# Patient Record
Sex: Male | Born: 1990 | Race: White | Hispanic: No | Marital: Single | Smoking: Current every day smoker
Health system: Southern US, Community
[De-identification: ages and names within clinical notes are randomized; demographics above are authoritative.]

## PROBLEM LIST (undated history)

## (undated) DIAGNOSIS — Z789 Other specified health status: Secondary | ICD-10-CM

## (undated) HISTORY — PX: OTHER SURGICAL HISTORY: SHX169

## (undated) HISTORY — DX: Other specified health status: Z78.9

---

## 2008-03-22 ENCOUNTER — Ambulatory Visit (HOSPITAL_COMMUNITY): Admission: RE | Admit: 2008-03-22 | Discharge: 2008-03-22 | Payer: Self-pay | Admitting: Internal Medicine

## 2009-01-04 ENCOUNTER — Ambulatory Visit (HOSPITAL_COMMUNITY): Admission: RE | Admit: 2009-01-04 | Discharge: 2009-01-04 | Payer: Self-pay | Admitting: Internal Medicine

## 2010-01-02 ENCOUNTER — Ambulatory Visit (HOSPITAL_COMMUNITY): Admission: RE | Admit: 2010-01-02 | Discharge: 2010-01-02 | Payer: Self-pay | Admitting: Internal Medicine

## 2010-07-23 ENCOUNTER — Encounter: Payer: Self-pay | Admitting: Internal Medicine

## 2015-04-13 ENCOUNTER — Emergency Department (HOSPITAL_COMMUNITY)
Admission: EM | Admit: 2015-04-13 | Discharge: 2015-04-13 | Disposition: A | Discharge status: Home / Self Care | Payer: Self-pay | Attending: Emergency Medicine | Admitting: Emergency Medicine

## 2015-04-13 ENCOUNTER — Encounter (HOSPITAL_COMMUNITY): Payer: Self-pay | Admitting: *Deleted

## 2015-04-13 DIAGNOSIS — Z720 Tobacco use: Secondary | ICD-10-CM | POA: Insufficient documentation

## 2015-04-13 DIAGNOSIS — K625 Hemorrhage of anus and rectum: Secondary | ICD-10-CM | POA: Insufficient documentation

## 2015-04-13 LAB — COMPREHENSIVE METABOLIC PANEL
ALBUMIN: 4.7 g/dL (ref 3.5–5.0)
ALT: 128 U/L — ABNORMAL HIGH (ref 17–63)
ANION GAP: 8 (ref 5–15)
AST: 65 U/L — ABNORMAL HIGH (ref 15–41)
Alkaline Phosphatase: 61 U/L (ref 38–126)
BILIRUBIN TOTAL: 0.6 mg/dL (ref 0.3–1.2)
BUN: 19 mg/dL (ref 6–20)
CALCIUM: 9.3 mg/dL (ref 8.9–10.3)
CO2: 26 mmol/L (ref 22–32)
Chloride: 105 mmol/L (ref 101–111)
Creatinine, Ser: 0.92 mg/dL (ref 0.61–1.24)
GLUCOSE: 92 mg/dL (ref 65–99)
POTASSIUM: 3.8 mmol/L (ref 3.5–5.1)
Sodium: 139 mmol/L (ref 135–145)
TOTAL PROTEIN: 7.7 g/dL (ref 6.5–8.1)

## 2015-04-13 LAB — CBC WITH DIFFERENTIAL/PLATELET
BASOS PCT: 0 %
Basophils Absolute: 0 10*3/uL (ref 0.0–0.1)
Eosinophils Absolute: 0 10*3/uL (ref 0.0–0.7)
Eosinophils Relative: 0 %
HEMATOCRIT: 44.9 % (ref 39.0–52.0)
Hemoglobin: 15.6 g/dL (ref 13.0–17.0)
Lymphocytes Relative: 22 %
Lymphs Abs: 2.8 10*3/uL (ref 0.7–4.0)
MCH: 35.4 pg — AB (ref 26.0–34.0)
MCHC: 34.7 g/dL (ref 30.0–36.0)
MCV: 101.8 fL — ABNORMAL HIGH (ref 78.0–100.0)
MONO ABS: 0.8 10*3/uL (ref 0.1–1.0)
Monocytes Relative: 6 %
NEUTROS ABS: 9.1 10*3/uL — AB (ref 1.7–7.7)
NEUTROS PCT: 72 %
Platelets: 247 10*3/uL (ref 150–400)
RBC: 4.41 MIL/uL (ref 4.22–5.81)
RDW: 13.5 % (ref 11.5–15.5)
WBC: 12.8 10*3/uL — AB (ref 4.0–10.5)

## 2015-04-13 NOTE — Discharge Instructions (Signed)
Follow-up with Dr.rourk the stomach specialist in 1-2 weeks for your rectal bleeding and elevated liver studies

## 2015-04-13 NOTE — ED Notes (Signed)
Patient is being discharged

## 2015-04-13 NOTE — ED Notes (Signed)
Pt states rectal bleeding, bright red in color with BMs. This has occurred intermittently x 2 years, occuring more often recently. Discomfort  to rectal area.

## 2015-04-15 NOTE — ED Provider Notes (Signed)
CSN: [id number]     Arrival date & time 04/13/15  1651 History   First MD Initiated Contact with Patient 04/13/15 1934     Chief Complaint  Patient presents with  . Rectal Bleeding     (Consider location/radiation/quality/duration/timing/severity/associated sxs/prior Treatment) Patient is a 24 y.o. male presenting with hematochezia. The history is provided by the patient (Patient states he has had some bright red bleeding per rectum off and on for months. No other medical problem).  Rectal Bleeding Quality:  Bright red Amount:  Moderate Timing:  Intermittent Progression:  Waxing and waning Chronicity:  New Context: not anal fissures   Associated symptoms: no abdominal pain     History reviewed. No pertinent past medical history. History reviewed. No pertinent past surgical history. No family history on file. Social History  Substance Use Topics  . Smoking status: Current Every Day Smoker    Types: Cigarettes  . Smokeless tobacco: None  . Alcohol Use: Yes     Comment: Occ    Review of Systems  Constitutional: Negative for appetite change and fatigue.  HENT: Negative for congestion, ear discharge and sinus pressure.   Eyes: Negative for discharge.  Respiratory: Negative for cough.   Cardiovascular: Negative for chest pain.  Gastrointestinal: Positive for hematochezia. Negative for abdominal pain and diarrhea.       Rectal bleeding  Genitourinary: Negative for frequency and hematuria.  Musculoskeletal: Negative for back pain.  Skin: Negative for rash.  Neurological: Negative for seizures and headaches.  Psychiatric/Behavioral: Negative for hallucinations.      Allergies  Review of patient's allergies indicates no known allergies.  Home Medications   Prior to Admission medications   Not on File   BP 136/89 mmHg  Pulse 71  Temp(Src) 98 F (36.7 C) (Oral)  Resp 20  Ht  (1.88 m)  Wt 250 lb (113.399 kg)  BMI 32.08 kg/m2  SpO2 98% Physical Exam   Constitutional: He is oriented to person, place, and time. He appears well-developed.  HENT:  Head: Normocephalic.  Eyes: Conjunctivae and EOM are normal. No scleral icterus.  Neck: Neck supple. No thyromegaly present.  Cardiovascular: Normal rate and regular rhythm.  Exam reveals no gallop and no friction rub.   No murmur heard. Pulmonary/Chest: No stridor. He has no wheezes. He has no rales. He exhibits no tenderness.  Abdominal: He exhibits no distension. There is no tenderness. There is no rebound.  Genitourinary:  Normal rectal exam heme-negative  Musculoskeletal: Normal range of motion. He exhibits no edema.  Lymphadenopathy:    He has no cervical adenopathy.  Neurological: He is oriented to person, place, and time. He exhibits normal muscle tone. Coordination normal.  Skin: No rash noted. No erythema.  Psychiatric: He has a normal mood and affect. His behavior is normal.    ED Course  Procedures (including critical care time) Labs Review Labs Reviewed  CBC WITH DIFFERENTIAL/PLATELET - Abnormal; Notable for the following:    WBC 12.8 (*)    MCV 101.8 (*)    MCH 35.4 (*)    Neutro Abs 9.1 (*)    All other components within normal limits  COMPREHENSIVE METABOLIC PANEL - Abnormal; Notable for the following:    AST 65 (*)    ALT 128 (*)    All other components within normal limits    Imaging Review No results found. I have personally reviewed and evaluated these images and lab results as part of my medical decision-making.   EKG Interpretation  None      MDM   Final diagnoses:  Rectal bleeding    Labs show mildly elevated liver studies. Patient will be referred to a GI doctor for his rectal bleeding and elevated liver study    Bethann BerkshireJoseph Bryley Kovacevic, MD 04/15/15 1017

## 2015-04-18 ENCOUNTER — Ambulatory Visit (INDEPENDENT_AMBULATORY_CARE_PROVIDER_SITE_OTHER): Payer: Self-pay | Admitting: Nurse Practitioner

## 2015-04-18 ENCOUNTER — Encounter: Payer: Self-pay | Admitting: Nurse Practitioner

## 2015-04-18 VITALS — BP 154/84 | HR 69 | Temp 97.6°F | Ht 74.0 in | Wt 245.4 lb

## 2015-04-18 DIAGNOSIS — K625 Hemorrhage of anus and rectum: Secondary | ICD-10-CM

## 2015-04-18 DIAGNOSIS — R945 Abnormal results of liver function studies: Secondary | ICD-10-CM

## 2015-04-18 DIAGNOSIS — R7989 Other specified abnormal findings of blood chemistry: Secondary | ICD-10-CM

## 2015-04-18 NOTE — Patient Instructions (Signed)
1. We will be due to stool tests to do at home. 2. Have your labs drawn when you're able. 3. Return for follow-up in 4 weeks.

## 2015-04-18 NOTE — Assessment & Plan Note (Signed)
Patient with complaints of rectal bleeding. Rectal exam with stool card for heme negative in the ER. This is again negative today. However, the bowel movement he just had an office he states he did not notice blood for the first time in a while. At this point we will send him home with an iFob for him to use when he notes what he feels to be blood in his stool and return for further evaluation. He does have a family history of colon cancer although this is a day paternal grandparent and not a first-degree relative. Depending on results of his heme iFob we can consider pursuing colonoscopy as needed. Return for follow-up in 4 weeks at which point we will recheck a CBC and hepatic function panel as noted below.

## 2015-04-18 NOTE — Assessment & Plan Note (Signed)
Noted to have elevated LFTs on his last CMP with AST/ALT 65/128. No other liver function derangements noted. Denies alcohol use. Does not currently take any medications. When he returns for follow-up in 4 weeks we will recheck his hepatic function panel as well as CBC. Can pursue other hepatic testing pending results of recheck of his liver function.

## 2015-04-18 NOTE — Progress Notes (Signed)
Primary Care Physician:  No PCP Per Patient Primary Gastroenterologist:  Dr. Darrick PennaFields  Chief Complaint  Patient presents with  . Rectal Bleeding  . Rectal Pain    HPI:   24 year old male presents for follow-up from the emergency room for rectal bleeding. ER notes reviewed. He presented on 04/13/2015 for hematochezia which was on and off for months. No abdominal pain at that time. ER exam notes heme negative stool. Hemoglobin 15.6, hematocrit 44.9. WBC 12.8. CMP showed elevated AST/ALT at 65/128.  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 It's been over a year. Sees blood every morning for the past month. Blood is on the stool and on the toilet tissue. Denies abdominal pain, N/V. Has sensation in his rectum "not feeling right." Has a bowel movement daily consistent with [place] 7, tends to sit for 5-10 minutes. Saw blood yesterday, no bowel movement today. Denies fever, chills, unintentional weight loss. Denies chest pain, dyspnea, dizziness, lightheadedness, syncope, near syncope. Denies any other upper or lower GI symptoms.  Past Medical History  Diagnosis Date  . Medical history non-contributory     "I don't really go to the doctor"    Past Surgical History  Procedure Laterality Date  . None to date      No current outpatient prescriptions on file.   No current facility-administered medications for this visit.    Allergies as of 04/18/2015  . (No Known Allergies)    Family History  Problem Relation Age of Onset  . Colon cancer Paternal Grandfather     Social History   Social History  . Marital Status: Single    Spouse Name: N/A  . Number of Children: N/A  . Years of Education: N/A   Occupational History  . Not on file.   Social History Main Topics  . Smoking status: Current Every Day Smoker -- 1.00 packs/day for 10 years    Types: Cigarettes  .  Smokeless tobacco: Never Used  . Alcohol Use: 0.0 oz/week    0 Standard drinks or equivalent per week     Comment: Rarely about 3-4 times a year.  . Drug Use: Yes    Special: Marijuana     Comment: occasionally  . Sexual Activity: Not on file   Other Topics Concern  . Not on file   Social History Narrative    Review of Systems: 10-point ROS negative except as per HPI    Physical Exam: BP 154/84 mmHg  Pulse 69  Temp(Src) 97.6 F (36.4 C) (Oral)  Ht 6\' 2"  (1.88 m)  Wt 245 lb 6.4 oz (111.313 kg)  BMI 31.49 kg/m2 General:   Alert and oriented. Pleasant and cooperative. Well-nourished and well-developed.  Head:  Normocephalic and atraumatic. Eyes:  Without icterus, sclera clear and conjunctiva pink.  Ears:  Normal auditory acuity. Cardiovascular:  S1, S2 present without murmurs appreciated. Extremities without clubbing or edema. Respiratory:  Clear to auscultation bilaterally. No wheezes, rales, or rhonchi. No distress.  Gastrointestinal:  +BS, soft, non-tender and non-distended. No HSM noted. No guarding or rebound. No masses appreciated.  Rectal:  No external or internal hemorrhoids appreciated, rectal exam hemoccult negative.  Musculoskalatal:  Symmetrical without gross deformities. Normal posture. Skin:  Intact without significant lesions or rashes. Neurologic:  Alert and oriented x4;  grossly normal neurologically. Psych:  Alert and cooperative. Normal mood and affect. Heme/Lymph/Immune: No  excessive bruising noted.    04/18/2015 9:13 AM

## 2015-04-19 NOTE — Progress Notes (Signed)
No pcp per patient 

## 2015-05-22 ENCOUNTER — Encounter: Payer: Self-pay | Admitting: Gastroenterology

## 2015-05-22 ENCOUNTER — Ambulatory Visit: Payer: Self-pay | Admitting: Gastroenterology

## 2017-05-12 ENCOUNTER — Emergency Department (HOSPITAL_COMMUNITY)
Admission: EM | Admit: 2017-05-12 | Discharge: 2017-05-12 | Disposition: A | Discharge status: Home / Self Care | Payer: Self-pay | Attending: Emergency Medicine | Admitting: Emergency Medicine

## 2017-05-12 ENCOUNTER — Encounter (HOSPITAL_COMMUNITY): Payer: Self-pay | Admitting: Cardiology

## 2017-05-12 DIAGNOSIS — F17210 Nicotine dependence, cigarettes, uncomplicated: Secondary | ICD-10-CM | POA: Insufficient documentation

## 2017-05-12 DIAGNOSIS — Z23 Encounter for immunization: Secondary | ICD-10-CM | POA: Insufficient documentation

## 2017-05-12 DIAGNOSIS — K645 Perianal venous thrombosis: Secondary | ICD-10-CM | POA: Insufficient documentation

## 2017-05-12 MED ORDER — TETANUS-DIPHTHERIA TOXOIDS TD 5-2 LFU IM INJ: 0.5000 mL | INJECTION | Freq: Once | INTRAMUSCULAR | Status: DC

## 2017-05-12 MED ORDER — LIDOCAINE HCL (PF) 1 % IJ SOLN
5.0000 mL | Freq: Once | INTRAMUSCULAR | Status: AC
Start: 2017-05-12 — End: 2017-05-12
  Administered 2017-05-12: 5 mL
  Filled 2017-05-12: qty 6

## 2017-05-12 MED ORDER — TETANUS-DIPHTH-ACELL PERTUSSIS 5-2.5-18.5 LF-MCG/0.5 IM SUSP
INTRAMUSCULAR | Status: AC
  Filled 2017-05-12: qty 0.5

## 2017-05-12 MED ORDER — IBUPROFEN 800 MG PO TABS
800.0000 mg | ORAL_TABLET | Freq: Once | ORAL | Status: AC
  Administered 2017-05-12: 800 mg via ORAL
  Filled 2017-05-12: qty 1

## 2017-05-12 MED ORDER — TETANUS-DIPHTH-ACELL PERTUSSIS 5-2.5-18.5 LF-MCG/0.5 IM SUSP
0.5000 mL | Freq: Once | INTRAMUSCULAR | Status: AC
  Administered 2017-05-12: 0.5 mL via INTRAMUSCULAR

## 2017-05-12 NOTE — ED Provider Notes (Signed)
[facility] Provider [facility]   CSN: [id number] Arrival date & time: 05/12/17  1432     History   Chief Complaint No chief complaint on file.   HPI Jamie Terry is a 26 y.o. male who presents to the ED with rectal pain. Patient reports that for the past few days he felt a knot at the anus and not sure if it is a hemorrhoid. The area is very painful. Patient has used cream with lidocaine and other OTC medications without relief. He has also used sitz baths.   The history is provided by the patient. No language interpreter was used.    Past Medical History:  Diagnosis Date  . Medical history non-contributory    "I don't really go to the doctor"    Patient Active Problem List   Diagnosis Date Noted  . Rectal bleeding 04/18/2015  . Elevated LFTs 04/18/2015    Past Surgical History:  Procedure Laterality Date  . None to date         Home Medications    Prior to Admission medications   Not on File    Family History Family History  Problem Relation Age of Onset  . Colon cancer Paternal Grandfather     Social History Social History   Tobacco Use  . Smoking status: Current Every Day Smoker    Packs/day: 1.00    Years: 10.00    Pack years: 10.00    Types: Cigarettes  . Smokeless tobacco: Never Used  Substance Use Topics  . Alcohol use: Yes    Alcohol/week: 0.0 oz    Comment: Rarely about 3-4 times a year.  . Drug use: Yes    Types: Marijuana    Comment: occasionally     Allergies   Patient has no known allergies.   Review of Systems Review of Systems  Constitutional: Negative for chills and fever.  HENT: Negative.   Eyes: Negative for visual disturbance.  Respiratory: Negative for cough.   Cardiovascular: Negative for chest pain.  Gastrointestinal: Positive for rectal pain. Negative for abdominal pain, nausea and vomiting.  Genitourinary: Negative for frequency and urgency.  Musculoskeletal: Negative for back pain.    Skin: Positive for color change.  Neurological: Negative for dizziness and headaches.  Psychiatric/Behavioral: Negative for confusion.     Physical Exam Updated Vital Signs BP (!) 163/86 (BP Location: Left Wrist)   Pulse 89   Temp 98.1 F (36.7 C) (Oral)   Resp 18   Ht 6\' 2"  (1.88 Terry)   Wt 127 kg (280 lb)   SpO2 97%   BMI 35.95 kg/Terry   Physical Exam  Constitutional: He appears well-developed and well-nourished. No distress.  HENT:  Head: Normocephalic.  Eyes: EOM are normal.  Neck: Neck supple.  Cardiovascular: Normal rate.  Pulmonary/Chest: Effort normal.  Thrombosed hemorrhoid noted   Genitourinary: Rectal exam shows external hemorrhoid.  Musculoskeletal: Normal range of motion.  Neurological: He is alert.  Skin: Skin is warm and dry.  Psychiatric: He has a normal mood and affect. His behavior is normal.  Nursing note and vitals reviewed.    ED Treatments / Results  Labs (all labs ordered are listed, but only abnormal results are displayed) Labs Reviewed - No data to display   Radiology No results found.  Procedures .Marland Kitchen.Incision and Drainage Date/Time: 05/12/2017 6:15 PM Performed by: Jamie Terry, Jamie Dye M, NP Authorized by: Jamie Terry, Jamie Claw M, NP   Consent:    Consent obtained:  Verbal   Consent  given by:  Patient   Risks discussed:  Bleeding, incomplete drainage and pain   Alternatives discussed:  Alternative treatment Location:    Type:  External thrombosed hemorrhoid   Location:  Anogenital   Anogenital location:  Rectum Pre-procedure details:    Skin preparation:  Betadine Anesthesia (see MAR for exact dosages):    Anesthesia method:  Local infiltration   Local anesthetic:  Lidocaine 1% w/o epi Procedure type:    Complexity:  Complex Procedure details:    Needle aspiration: no     Incision types:  Single straight   Scalpel blade:  11   Wound management:  Probed and deloculated and irrigated with saline   Drainage:  Bloody   Drainage amount:   Moderate   Wound treatment:  Wound left open Post-procedure details:    Patient tolerance of procedure:  Tolerated well, no immediate complications   (including critical care time)  Medications Ordered in ED Medications  Tdap (BOOSTRIX) 5-2.5-18.5 LF-MCG/0.5 injection (not administered)  lidocaine (PF) (XYLOCAINE) 1 % injection 5 mL (5 mLs Infiltration Given 05/12/17 1828)  ibuprofen (ADVIL,MOTRIN) tablet 800 mg (800 mg Oral Given 05/12/17 1924)  Tdap (BOOSTRIX) injection 0.5 mL (0.5 mLs Intramuscular Given 05/12/17 1924)     Initial Impression / Assessment and Plan / ED Course  I have reviewed the triage vital signs and the nursing notes. 26 y.o. male with thrombosed hemorrhoid stable for d/c after area opened and clot removed. Discussed with the patient home care and f/u with general surgeon. Referral given. Patient agrees with plan.  Return precautions discussed. Final Clinical Impressions(s) / ED Diagnoses   Final diagnoses:  External hemorrhoid, thrombosed    ED Discharge Orders    None       Jamie Terry, Jamie Terry, [place]NP 05/12/17 Jamie Asal2000    Jamie Terry, Joseph, MD 05/13/17 [phone]

## 2017-05-12 NOTE — ED Triage Notes (Signed)
Per girlfriend pt has a mass coming out of his anus.  Pt c/o pain.

## 2017-05-14 ENCOUNTER — Encounter (HOSPITAL_COMMUNITY): Payer: Self-pay | Admitting: Emergency Medicine

## 2017-05-14 ENCOUNTER — Emergency Department (HOSPITAL_COMMUNITY)
Admission: EM | Admit: 2017-05-14 | Discharge: 2017-05-14 | Disposition: A | Discharge status: Home / Self Care | Payer: Self-pay | Attending: Emergency Medicine | Admitting: Emergency Medicine

## 2017-05-14 ENCOUNTER — Emergency Department (HOSPITAL_COMMUNITY): Payer: Self-pay

## 2017-05-14 ENCOUNTER — Other Ambulatory Visit: Payer: Self-pay

## 2017-05-14 DIAGNOSIS — R072 Precordial pain: Secondary | ICD-10-CM | POA: Insufficient documentation

## 2017-05-14 DIAGNOSIS — F17210 Nicotine dependence, cigarettes, uncomplicated: Secondary | ICD-10-CM | POA: Insufficient documentation

## 2017-05-14 LAB — I-STAT TROPONIN, ED
TROPONIN I, POC: 0 ng/mL (ref 0.00–0.08)
Troponin i, poc: 0 ng/mL (ref 0.00–0.08)

## 2017-05-14 LAB — I-STAT CHEM 8, ED
BUN: 12 mg/dL (ref 6–20)
CALCIUM ION: 1.08 mmol/L — AB (ref 1.15–1.40)
CHLORIDE: 105 mmol/L (ref 101–111)
Creatinine, Ser: 0.8 mg/dL (ref 0.61–1.24)
Glucose, Bld: 107 mg/dL — ABNORMAL HIGH (ref 65–99)
HCT: 44 % (ref 39.0–52.0)
Hemoglobin: 15 g/dL (ref 13.0–17.0)
POTASSIUM: 3.5 mmol/L (ref 3.5–5.1)
SODIUM: 140 mmol/L (ref 135–145)
TCO2: 24 mmol/L (ref 22–32)

## 2017-05-14 MED ORDER — NITROGLYCERIN 0.4 MG SL SUBL
0.4000 mg | SUBLINGUAL_TABLET | SUBLINGUAL | Status: DC | PRN
  Administered 2017-05-14: 0.4 mg via SUBLINGUAL
  Filled 2017-05-14: qty 1

## 2017-05-14 MED ORDER — GI COCKTAIL ~~LOC~~
30.0000 mL | Freq: Once | ORAL | Status: AC
  Administered 2017-05-14: 30 mL via ORAL
  Filled 2017-05-14: qty 30

## 2017-05-14 NOTE — ED Notes (Signed)
I-Stat POC Troponin: 0.00

## 2017-05-14 NOTE — ED Notes (Signed)
POC Troponin 0.00

## 2017-05-14 NOTE — ED Provider Notes (Signed)
[facility] Provider [facility]   CSN: [id number] Arrival date & time: 05/14/17  [id number]     History   Chief Complaint Chief Complaint  Patient presents with  . Chest Pain    HPI Jamie Terry is a 26 y.o. male.  The history is provided by the patient and a significant other.  Chest Pain   This is a new problem. The current episode started less than 1 hour ago. The problem occurs constantly. The problem has not changed since onset.The pain is associated with rest. The pain is present in the substernal region. The pain is severe. The quality of the pain is described as pressure-like. The pain does not radiate. Associated symptoms include dizziness and shortness of breath. Pertinent negatives include no fever, no hemoptysis, no syncope and no vomiting. Treatments tried: ASA. The treatment provided no relief. Risk factors include male gender and smoking/tobacco exposure.  Pertinent negatives for past medical history include no CAD and no PE.  His family medical history is significant for heart disease.   Pt reports onset of chest pain/pressure while at rest Family reports he was diaphoretic Pt reports feeling SOB He has never had this before No known medical conditions He is a heavy smoker He reports fam h/o CAD No recent travel/surgery  Past Medical History:  Diagnosis Date  . Medical history non-contributory    "I don't really go to the doctor"    Patient Active Problem List   Diagnosis Date Noted  . Rectal bleeding 04/18/2015  . Elevated LFTs 04/18/2015    Past Surgical History:  Procedure Laterality Date  . None to date         Home Medications    Prior to Admission medications   Not on File    Family History Family History  Problem Relation Age of Onset  . Colon cancer Paternal Grandfather     Social History Social History   Tobacco Use  . Smoking status: Current Every Day Smoker    Packs/day: 1.00    Years: 10.00    Pack  years: 10.00    Types: Cigarettes  . Smokeless tobacco: Never Used  Substance Use Topics  . Alcohol use: Yes    Alcohol/week: 0.0 oz    Comment: Rarely about 3-4 times a year.  . Drug use: Yes    Types: Marijuana    Comment: occasionally     Allergies   Patient has no known allergies.   Review of Systems Review of Systems  Constitutional: Negative for fever.  Respiratory: Positive for shortness of breath. Negative for hemoptysis.   Cardiovascular: Positive for chest pain. Negative for syncope.  Gastrointestinal: Negative for vomiting.  Neurological: Positive for dizziness and light-headedness.  All other systems reviewed and are negative.    Physical Exam Updated Vital Signs BP 140/83   Pulse 70   Temp 97.9 F (36.6 C)   Resp 17   Ht 1.88 m (6\' 2" )   Wt 127 kg (280 lb)   SpO2 99%   BMI 35.95 kg/m   Physical Exam CONSTITUTIONAL: Well developed/well nourished, no distress HEAD: Normocephalic/atraumatic EYES: EOMI/PERRL ENMT: Mucous membranes moist NECK: supple no meningeal signs SPINE/BACK:entire spine nontender CV: S1/S2 noted, no murmurs/rubs/gallops noted LUNGS: Lungs are clear to auscultation bilaterally, no apparent distress ABDOMEN: soft, nontender, no rebound or guarding, bowel sounds noted throughout abdomen GU:no cva tenderness NEURO: Pt is awake/alert/appropriate, moves all extremitiesx4.  No facial droop.   EXTREMITIES: pulses normal/equal, full ROM, no  calf tenderness noted SKIN: warm, color normal PSYCH: no abnormalities of mood noted, alert and oriented to situation   ED Treatments / Results  Labs (all labs ordered are listed, but only abnormal results are displayed) Labs Reviewed  I-STAT CHEM 8, ED - Abnormal; Notable for the following components:      Result Value   Glucose, Bld 107 (*)    Calcium, Ion 1.08 (*)    All other components within normal limits  I-STAT TROPONIN, ED  I-STAT TROPONIN, ED    EKG  EKG  Interpretation  Date/Time:  Wednesday May 14 2017 02:44:58 EST Ventricular Rate:  61 PR Interval:    QRS Duration: 117 QT Interval:  383 QTC Calculation: 386 R Axis:   103 Text Interpretation:  Sinus rhythm Nonspecific intraventricular conduction delay Baseline wander in lead(s) V5 V6 No previous ECGs available Confirmed by Zadie RhineWickline, Mylin Hirano ([id number]) on 05/14/2017 2:57:05 AM       Radiology Dg Chest 2 View  Result Date: 05/14/2017 CLINICAL DATA:  Acute onset of mid to right upper chest pain. EXAM: CHEST  2 VIEW COMPARISON:  None. FINDINGS: The lungs are well-aerated. Mild left basilar atelectasis is noted. There is no evidence of pleural effusion or pneumothorax. The heart is normal in size; the mediastinal contour is within normal limits. No acute osseous abnormalities are seen. IMPRESSION: Mild bibasilar atelectasis noted.  Lungs otherwise clear. Electronically Signed   By: Roanna RaiderJeffery  Chang M.D.   On: 05/14/2017 03:34    Procedures Procedures  Medications Ordered in ED Medications  nitroGLYCERIN (NITROSTAT) SL tablet 0.4 mg (0.4 mg Sublingual Given 05/14/17 0330)  gi cocktail (Maalox,Lidocaine,Donnatal) (not administered)     Initial Impression / Assessment and Plan / ED Course  I have reviewed the triage vital signs and the nursing notes.  Pertinent labs & imaging results that were available during my care of the patient were reviewed by me and considered in my medical decision making (see chart for details).     3:56 AM Pt well appearing/no distress HEART score - 3 Appears PERC negative Advised to quit smoking Will continue to monitor 6:39 AM Pt stable Vitals improved Continues to be PERC negative Repeat troponin negative Suspicion for ACS/PE/Dissection is low He reports continued pain, but he is in no distress Will d/c home  Referred to PCP We discussed strict ER return precautions   Final Clinical Impressions(s) / ED Diagnoses   Final diagnoses:   Precordial pain    ED Discharge Orders    None       Zadie RhineWickline, Gage Treiber, MD 05/14/17 [phone]

## 2017-05-14 NOTE — ED Triage Notes (Signed)
Pt c/o chest tightness and states heart started racing around 2330.

## 2017-05-14 NOTE — Discharge Instructions (Signed)

## 2017-09-21 ENCOUNTER — Encounter (HOSPITAL_COMMUNITY): Payer: Self-pay | Admitting: *Deleted

## 2017-09-21 ENCOUNTER — Emergency Department (HOSPITAL_COMMUNITY)
Admission: EM | Admit: 2017-09-21 | Discharge: 2017-09-21 | Disposition: A | Discharge status: Home / Self Care | Payer: Self-pay | Attending: Emergency Medicine | Admitting: Emergency Medicine

## 2017-09-21 ENCOUNTER — Other Ambulatory Visit: Payer: Self-pay

## 2017-09-21 DIAGNOSIS — T1501XA Foreign body in cornea, right eye, initial encounter: Secondary | ICD-10-CM | POA: Insufficient documentation

## 2017-09-21 DIAGNOSIS — H18891 Other specified disorders of cornea, right eye: Secondary | ICD-10-CM

## 2017-09-21 DIAGNOSIS — Y998 Other external cause status: Secondary | ICD-10-CM | POA: Insufficient documentation

## 2017-09-21 DIAGNOSIS — X58XXXA Exposure to other specified factors, initial encounter: Secondary | ICD-10-CM | POA: Insufficient documentation

## 2017-09-21 DIAGNOSIS — F17210 Nicotine dependence, cigarettes, uncomplicated: Secondary | ICD-10-CM | POA: Insufficient documentation

## 2017-09-21 DIAGNOSIS — Y9389 Activity, other specified: Secondary | ICD-10-CM | POA: Insufficient documentation

## 2017-09-21 DIAGNOSIS — Y929 Unspecified place or not applicable: Secondary | ICD-10-CM | POA: Insufficient documentation

## 2017-09-21 DIAGNOSIS — T1591XA Foreign body on external eye, part unspecified, right eye, initial encounter: Secondary | ICD-10-CM

## 2017-09-21 MED ORDER — CYCLOPENTOLATE-PHENYLEPHRINE 0.2-1 % OP SOLN
OPHTHALMIC | Status: AC
  Filled 2017-09-21: qty 2

## 2017-09-21 MED ORDER — HYDROCODONE-ACETAMINOPHEN 5-325 MG PO TABS: 1.0000 | ORAL_TABLET | ORAL | 0 refills | Status: DC | PRN

## 2017-09-21 MED ORDER — ERYTHROMYCIN 5 MG/GM OP OINT
TOPICAL_OINTMENT | Freq: Once | OPHTHALMIC | Status: AC
  Administered 2017-09-21: 23:00:00 via OPHTHALMIC
  Filled 2017-09-21: qty 3.5

## 2017-09-21 MED ORDER — CYCLOPENTOLATE HCL 1 % OP SOLN
1.0000 [drp] | Freq: Once | OPHTHALMIC | Status: AC
  Administered 2017-09-21: 1 [drp] via OPHTHALMIC
  Filled 2017-09-21: qty 2

## 2017-09-21 MED ORDER — CIPROFLOXACIN HCL 0.3 % OP SOLN: OPHTHALMIC | 0 refills | Status: DC

## 2017-09-21 MED ORDER — TETRACAINE HCL 0.5 % OP SOLN
2.0000 [drp] | Freq: Once | OPHTHALMIC | Status: AC
  Administered 2017-09-21: 2 [drp] via OPHTHALMIC
  Filled 2017-09-21: qty 4

## 2017-09-21 MED ORDER — FLUORESCEIN SODIUM 1 MG OP STRP
1.0000 | ORAL_STRIP | Freq: Once | OPHTHALMIC | Status: AC
  Administered 2017-09-21: 1 via OPHTHALMIC
  Filled 2017-09-21: qty 1

## 2017-09-21 NOTE — ED Triage Notes (Signed)
Pt reports having metal shavings in his right eye since Thursday. Wife states they have tried to wash it out with minimal relief.

## 2017-09-21 NOTE — ED Provider Notes (Signed)
[facility] EMERGENCY DEPARTMENT Provider Note   CSN: [id number] Arrival date & time: 09/21/17  2108     History   Chief Complaint Chief Complaint  Patient presents with  . Eye Injury    HPI Jamie Terry is a 27 y.o. male.  Pt presents to the ED today with a piece of metal in right eye from grinding metal at work.  Pt said it's been in there since Thursday, the 21st.  His wife has tried to wash it out with saline wash w/o success.     Past Medical History:  Diagnosis Date  . Medical history non-contributory    "I don't really go to the doctor"    Patient Active Problem List   Diagnosis Date Noted  . Rectal bleeding 04/18/2015  . Elevated LFTs 04/18/2015    Past Surgical History:  Procedure Laterality Date  . None to date          Home Medications    Prior to Admission medications   Medication Sig Start Date End Date Taking? Authorizing Provider  acetaminophen (TYLENOL) 500 MG tablet Take 500 mg by mouth as needed.   Yes [provider]  ciprofloxacin (CILOXAN) 0.3 % ophthalmic solution Administer 1 drop, every 2 hours, while awake, for 2 days. Then 1 drop, every 4 hours, while awake, for the next 5 days. 09/21/17   Jacalyn LefevreHaviland, Ewald Beg, MD  HYDROcodone-acetaminophen (NORCO/VICODIN) 5-325 MG tablet Take 1 tablet by mouth every 4 (four) hours as needed. 09/21/17   Jacalyn LefevreHaviland, Sammuel Blick, MD    Family History Family History  Problem Relation Age of Onset  . Colon cancer Paternal Grandfather     Social History Social History   Tobacco Use  . Smoking status: Current Every Day Smoker    Packs/day: 1.00    Years: 10.00    Pack years: 10.00    Types: Cigarettes  . Smokeless tobacco: Never Used  Substance Use Topics  . Alcohol use: Yes    Alcohol/week: 0.0 oz    Comment: Rarely about 3-4 times a year.  . Drug use: Not Currently     Allergies   Patient has no known allergies.   Review of Systems Review of Systems  Eyes: Positive for pain.  All  other systems reviewed and are negative.    Physical Exam Updated Vital Signs BP (!) 160/82 (BP Location: Right Arm)   Pulse 71   Temp 98.2 F (36.8 C) (Oral)   Resp 20   Ht 6\' 2"  (1.88 m)   Wt 122.5 kg (270 lb)   SpO2 100%   BMI 34.67 kg/m   Physical Exam  Constitutional: He is oriented to person, place, and time. He appears well-developed and well-nourished.  HENT:  Head: Normocephalic and atraumatic.  Right Ear: External ear normal.  Left Ear: External ear normal.  Nose: Nose normal.  Mouth/Throat: Oropharynx is clear and moist.  Eyes: Pupils are equal, round, and reactive to light. EOM are normal. Foreign body present in the right eye.  Slit lamp exam:      The right eye shows fluorescein uptake.  Rust ring right eye  Neck: Normal range of motion. Neck supple.  Cardiovascular: Normal rate, regular rhythm, normal heart sounds and intact distal pulses.  Pulmonary/Chest: Effort normal and breath sounds normal.  Abdominal: Soft. Bowel sounds are normal.  Musculoskeletal: Normal range of motion.  Neurological: He is alert and oriented to person, place, and time.  Skin: Skin is warm. Capillary refill takes less  than 2 seconds.  Psychiatric: He has a normal mood and affect. His behavior is normal. Judgment and thought content normal.  Nursing note and vitals reviewed.    ED Treatments / Results  Labs (all labs ordered are listed, but only abnormal results are displayed) Labs Reviewed - No data to display  EKG None  Radiology No results found.  Procedures .Foreign Body Removal Date/Time: 09/21/2017 9:59 PM Performed by: Jacalyn Lefevre, MD Authorized by: Jacalyn Lefevre, MD  Consent: Verbal consent obtained. Consent given by: patient Patient understanding: patient states understanding of the procedure being performed Patient consent: the patient's understanding of the procedure matches consent given Required items: required blood products, implants, devices,  and special equipment available Patient identity confirmed: verbally with patient Time out: Immediately prior to procedure a "time out" was called to verify the correct patient, procedure, equipment, support staff and site/side marked as required. Body area: eye Location details: right cornea Anesthesia: local infiltration  Anesthesia: Local Anesthetic: tetracaine drops Anesthetic total: 1 mL  Sedation: Patient sedated: no  Patient restrained: no Patient cooperative: yes Localization method: eyelid eversion, magnification and visualized Removal mechanism: ophthalmic burr, moist cotton swab and 27-gauge needle Eye examined with fluorescein. Corneal abrasion size: small Corneal abrasion location: medial and superior Residual rust ring present. Residual rust ring removed. Dressing: antibiotic drops Depth: embedded Complexity: complex Post-procedure assessment: foreign body removed Patient tolerance: Patient tolerated the procedure well with no immediate complications Comments: May have a small component of rust ring still there, but I can't see it any more.   (including critical care time)  Medications Ordered in ED Medications  erythromycin ophthalmic ointment (has no administration in time range)  cyclopentolate (CYCLODRYL,CYCLOGYL) 1 % ophthalmic solution 1 drop (has no administration in time range)  fluorescein ophthalmic strip 1 strip (1 strip Right Eye Given 09/21/17 2125)  tetracaine (PONTOCAINE) 0.5 % ophthalmic solution 2 drop (2 drops Right Eye Given 09/21/17 2125)     Initial Impression / Assessment and Plan / ED Course  I have reviewed the triage vital signs and the nursing notes.  Pertinent labs & imaging results that were available during my care of the patient were reviewed by me and considered in my medical decision making (see chart for details).  Pt instructed to wear safety glasses when grinding metal.  Return if worse and f/u with ophthalmology.   Final  Clinical Impressions(s) / ED Diagnoses   Final diagnoses:  Eye foreign body, right, initial encounter  Corneal rust ring of right eye    ED Discharge Orders        Ordered    HYDROcodone-acetaminophen (NORCO/VICODIN) 5-325 MG tablet  Every 4 hours PRN     09/21/17 2203    ciprofloxacin (CILOXAN) 0.3 % ophthalmic solution     09/21/17 2203       Jacalyn Lefevre, MD 09/21/17 2204

## 2017-10-07 ENCOUNTER — Ambulatory Visit: Payer: Self-pay | Admitting: Family Medicine

## 2017-10-07 ENCOUNTER — Encounter: Payer: Self-pay | Admitting: Family Medicine

## 2017-10-07 NOTE — Progress Notes (Deleted)
Subjective: MV:HQIONGEXB care, *** HPI: Jamie Terry is a 27 y.o. male presenting to clinic today for:  1. ***  Past Medical History:  Diagnosis Date  . Medical history non-contributory    "I don't really go to the doctor"   Past Surgical History:  Procedure Laterality Date  . None to date     Social History   Socioeconomic History  . Marital status: Single    Spouse name: Not on file  . Number of children: Not on file  . Years of education: Not on file  . Highest education level: Not on file  Occupational History  . Not on file  Social Needs  . Financial resource strain: Not on file  . Food insecurity:    Worry: Not on file    Inability: Not on file  . Transportation needs:    Medical: Not on file    Non-medical: Not on file  Tobacco Use  . Smoking status: Current Every Day Smoker    Packs/day: 1.00    Years: 10.00    Pack years: 10.00    Types: Cigarettes  . Smokeless tobacco: Never Used  Substance and Sexual Activity  . Alcohol use: Yes    Alcohol/week: 0.0 oz    Comment: Rarely about 3-4 times a year.  . Drug use: Not Currently  . Sexual activity: Not on file  Lifestyle  . Physical activity:    Days per week: Not on file    Minutes per session: Not on file  . Stress: Not on file  Relationships  . Social connections:    Talks on phone: Not on file    Gets together: Not on file    Attends religious service: Not on file    Active member of club or organization: Not on file    Attends meetings of clubs or organizations: Not on file    Relationship status: Not on file  . Intimate partner violence:    Fear of current or ex partner: Not on file    Emotionally abused: Not on file    Physically abused: Not on file    Forced sexual activity: Not on file  Other Topics Concern  . Not on file  Social History Narrative  . Not on file   No outpatient medications have been marked as taking for the 10/07/17 encounter (Appointment) with Raliegh Ip, DO.   Family History  Problem Relation Age of Onset  . Colon cancer Paternal Grandfather    No Known Allergies   Health Maintenance: ***  Flu Vaccine: {YES/NO/WILD MWUXL:24401}  Tdap Vaccine: {YES/NO/WILD UUVOZ:36644}  - every 64yrs - (<3 lifetime doses or unknown): all wounds -- look up need for Tetanus IG - (>=3 lifetime doses): clean/minor wound if >30yrs from previous; all other wounds if >9yrs from previous Zoster Vaccine: {YES/NO/WILD CARDS:18581} (those >50yo, once) Pneumonia Vaccine: {YES/NO/WILD IHKVQ:25956} (those w/ risk factors) - (<21yr) Both: Immunocompromised, cochlear implant, CSF leak, asplenic, sickle cell, Chronic Renal Failure - (<32yr) PPSV-23 only: Heart dz, lung disease, DM, tobacco abuse, alcoholism, cirrhosis/liver disease. - (>32yr): PPSV13 then PPSV23 in 6-12mths;  - (>67yr): repeat PPSV23 once if pt received prior to 29yo and 89yrs have passed  ROS: Per HPI  Objective: Office vital signs reviewed. There were no vitals taken for this visit.  Physical Examination:  General: Awake, alert, *** nourished, No acute distress HEENT: Normal    Neck: No masses palpated. No lymphadenopathy    Ears: Tympanic membranes intact, normal  light reflex, no erythema, no bulging    Eyes: PERRLA, extraocular movement in tact, sclera ***    Nose: nasal turbinates moist, *** nasal discharge    Throat: moist mucus membranes, no erythema, *** tonsillar exudate.  Airway is patent Cardio: regular rate and rhythm, S1S2 heard, no murmurs appreciated Pulm: clear to auscultation bilaterally, no wheezes, rhonchi or rales; normal work of breathing on room air GI: soft, non-tender, non-distended, bowel sounds present x4, no hepatomegaly, no splenomegaly, no masses GU: external vaginal tissue ***, cervix ***, *** punctate lesions on cervix appreciated, *** discharge from cervical os, *** bleeding, *** cervical motion tenderness, *** abdominal/ adnexal masses Extremities: warm, well  perfused, No edema, cyanosis or clubbing; +*** pulses bilaterally MSK: *** gait and *** station Skin: dry; intact; no rashes or lesions Neuro: *** Strength and light touch sensation grossly intact, *** DTRs ***/4  Assessment/ Plan: 27 y.o. male   No problem-specific Assessment & Plan notes found for this encounter.   Raliegh IpAshly M Gottschalk, DO [facility] [phone]

## 2019-03-11 ENCOUNTER — Other Ambulatory Visit: Payer: Self-pay

## 2019-03-11 ENCOUNTER — Emergency Department (HOSPITAL_COMMUNITY)
Admission: EM | Admit: 2019-03-11 | Discharge: 2019-03-11 | Disposition: A | Discharge status: Home / Self Care | Payer: Self-pay | Attending: Emergency Medicine | Admitting: Emergency Medicine

## 2019-03-11 ENCOUNTER — Encounter (HOSPITAL_COMMUNITY): Payer: Self-pay | Admitting: Emergency Medicine

## 2019-03-11 DIAGNOSIS — F17210 Nicotine dependence, cigarettes, uncomplicated: Secondary | ICD-10-CM | POA: Insufficient documentation

## 2019-03-11 DIAGNOSIS — Z23 Encounter for immunization: Secondary | ICD-10-CM | POA: Insufficient documentation

## 2019-03-11 DIAGNOSIS — Y999 Unspecified external cause status: Secondary | ICD-10-CM | POA: Insufficient documentation

## 2019-03-11 DIAGNOSIS — Y9289 Other specified places as the place of occurrence of the external cause: Secondary | ICD-10-CM | POA: Insufficient documentation

## 2019-03-11 DIAGNOSIS — S39012A Strain of muscle, fascia and tendon of lower back, initial encounter: Secondary | ICD-10-CM | POA: Insufficient documentation

## 2019-03-11 DIAGNOSIS — X500XXA Overexertion from strenuous movement or load, initial encounter: Secondary | ICD-10-CM | POA: Insufficient documentation

## 2019-03-11 DIAGNOSIS — Y9389 Activity, other specified: Secondary | ICD-10-CM | POA: Insufficient documentation

## 2019-03-11 MED ORDER — INFLUENZA VAC SPLIT QUAD 0.5 ML IM SUSY
0.5000 mL | PREFILLED_SYRINGE | INTRAMUSCULAR | Status: AC
  Administered 2019-03-11: 0.5 mL via INTRAMUSCULAR
  Filled 2019-03-11: qty 0.5

## 2019-03-11 MED ORDER — METHOCARBAMOL 500 MG PO TABS: 500.0000 mg | ORAL_TABLET | Freq: Four times a day (QID) | ORAL | 0 refills | Status: AC | PRN

## 2019-03-11 MED ORDER — DICLOFENAC SODIUM 1 % TD GEL: 2.0000 g | Freq: Four times a day (QID) | TRANSDERMAL | 0 refills | Status: AC | PRN

## 2019-03-11 NOTE — ED Triage Notes (Signed)
Patient states he lifted something heavy yesterday, developed mid lumbar pain. This am he reports pain down both legs.

## 2019-03-11 NOTE — ED Provider Notes (Signed)
Emergency Department Provider Note   I have reviewed the triage vital signs and the nursing notes.   HISTORY  Chief Complaint Back Pain   HPI CAYSEN WHANG is a 28 y.o. male presents to the ED with lower back pain radiating down the left leg. He denies weakness/numbness in the legs. No bowel/bladder symptoms. No groin numbness. No fever. No IVDA. Patient was lifting something heavy at work and felt a "pop" followed by pain. Has been trying topical meds and OTC meds at home without relief.    Past Medical History:  Diagnosis Date  . Medical history non-contributory    "I don't really go to the doctor"    Patient Active Problem List   Diagnosis Date Noted  . Rectal bleeding 04/18/2015  . Elevated LFTs 04/18/2015    Past Surgical History:  Procedure Laterality Date  . None to date      Allergies Patient has no known allergies.  Family History  Problem Relation Age of Onset  . Colon cancer Paternal Grandfather     Social History Social History   Tobacco Use  . Smoking status: Current Every Day Smoker    Packs/day: 1.00    Years: 10.00    Pack years: 10.00    Types: Cigarettes  . Smokeless tobacco: Never Used  Substance Use Topics  . Alcohol use: Not Currently    Alcohol/week: 0.0 standard drinks    Comment: Rarely about 3-4 times a year.  . Drug use: Not Currently    Review of Systems  Constitutional: No fever/chills Eyes: No visual changes. ENT: No sore throat. Cardiovascular: Denies chest pain. Respiratory: Denies shortness of breath. Gastrointestinal: No abdominal pain.  No nausea, no vomiting.  No diarrhea.  No constipation. Genitourinary: Negative for dysuria. Musculoskeletal: Positive for back pain. Skin: Negative for rash. Neurological: Negative for headaches, focal weakness or numbness.  10-point ROS otherwise negative.  ____________________________________________   PHYSICAL EXAM:  VITAL SIGNS: ED Triage Vitals  Enc Vitals  Group     BP 03/11/19 1114 (!) 148/85     Pulse Rate 03/11/19 1114 68     Resp 03/11/19 1114 16     Temp 03/11/19 1114 98.2 F (36.8 C)     Temp Source 03/11/19 1114 Oral     SpO2 03/11/19 1114 100 %   Constitutional: Alert and oriented. Well appearing and in no acute distress. Eyes: Conjunctivae are normal. Head: Atraumatic. Nose: No congestion/rhinnorhea. Mouth/Throat: Mucous membranes are moist.  Neck: No stridor.   Cardiovascular: Normal rate, regular rhythm. Respiratory: Normal respiratory effort.   Gastrointestinal: No distention.  }Musculoskeletal: No gross deformities of extremities. Neurologic:  Normal speech and language. No gross focal neurologic deficits are appreciated. Normal strength and sensation in the bilateral LEs. Normal patellar reflexes bilaterally.  Skin:  Skin is warm, dry and intact. No rash noted.  ____________________________________________  RADIOLOGY  None ____________________________________________   PROCEDURES  Procedure(s) performed:   Procedures  None ____________________________________________   INITIAL IMPRESSION / ASSESSMENT AND PLAN / ED COURSE  Pertinent labs & imaging results that were available during my care of the patient were reviewed by me and considered in my medical decision making (see chart for details).   Patient presents to the ED with back pain and sciatica on the left. No weakness/numbness. Normal exam with the exception of pain with ROM of the left leg radiating to the back. Question bulging disc, strain, or sciatica. No evidence on exam or history to prompt MRI or  other imaging of the back. No evidence to suspect cauda equina, epidural abscess, or critical spinal stenosis.    ____________________________________________  FINAL CLINICAL IMPRESSION(S) / ED DIAGNOSES  Final diagnoses:  Strain of lumbar region, initial encounter     MEDICATIONS GIVEN DURING THIS VISIT:  Medications  influenza vac split  quadrivalent PF (FLUARIX) injection 0.5 mL (0.5 mLs Intramuscular Given 03/11/19 1134)     NEW OUTPATIENT MEDICATIONS STARTED DURING THIS VISIT:  Discharge Medication List as of 03/11/2019 12:36 PM    START taking these medications   Details  diclofenac sodium (VOLTAREN) 1 % GEL Apply 2 g topically 4 (four) times daily as needed., Starting Thu 03/11/2019, Print    methocarbamol (ROBAXIN) 500 MG tablet Take 1 tablet (500 mg total) by mouth every 6 (six) hours as needed for muscle spasms., Starting Thu 03/11/2019, Print        Note:  This document was prepared using Dragon voice recognition software and may include unintentional dictation errors.  Alona BeneJoshua Long, MD Emergency Medicine    Long, Arlyss RepressJoshua G, MD 03/12/19 [phone]

## 2019-03-11 NOTE — Discharge Instructions (Signed)

## 2019-06-25 ENCOUNTER — Emergency Department (HOSPITAL_COMMUNITY): Payer: Self-pay

## 2019-06-25 ENCOUNTER — Encounter (HOSPITAL_COMMUNITY): Payer: Self-pay | Admitting: *Deleted

## 2019-06-25 ENCOUNTER — Emergency Department (HOSPITAL_COMMUNITY)
Admission: EM | Admit: 2019-06-25 | Discharge: 2019-06-25 | Disposition: A | Discharge status: Home / Self Care | Payer: Self-pay | Attending: Emergency Medicine | Admitting: Emergency Medicine

## 2019-06-25 ENCOUNTER — Other Ambulatory Visit: Payer: Self-pay

## 2019-06-25 DIAGNOSIS — F17210 Nicotine dependence, cigarettes, uncomplicated: Secondary | ICD-10-CM | POA: Insufficient documentation

## 2019-06-25 DIAGNOSIS — K029 Dental caries, unspecified: Secondary | ICD-10-CM

## 2019-06-25 DIAGNOSIS — K0889 Other specified disorders of teeth and supporting structures: Secondary | ICD-10-CM | POA: Insufficient documentation

## 2019-06-25 LAB — CBC WITH DIFFERENTIAL/PLATELET
Abs Immature Granulocytes: 0.03 10*3/uL (ref 0.00–0.07)
Basophils Absolute: 0 10*3/uL (ref 0.0–0.1)
Basophils Relative: 0 %
Eosinophils Absolute: 0 10*3/uL (ref 0.0–0.5)
Eosinophils Relative: 0 %
HCT: 45.6 % (ref 39.0–52.0)
Hemoglobin: 15.5 g/dL (ref 13.0–17.0)
Immature Granulocytes: 0 %
Lymphocytes Relative: 13 %
Lymphs Abs: 1.9 10*3/uL (ref 0.7–4.0)
MCH: 34.7 pg — ABNORMAL HIGH (ref 26.0–34.0)
MCHC: 34 g/dL (ref 30.0–36.0)
MCV: 102 fL — ABNORMAL HIGH (ref 80.0–100.0)
Monocytes Absolute: 0.9 10*3/uL (ref 0.1–1.0)
Monocytes Relative: 6 %
Neutro Abs: 11.6 10*3/uL — ABNORMAL HIGH (ref 1.7–7.7)
Neutrophils Relative %: 81 %
Platelets: 286 10*3/uL (ref 150–400)
RBC: 4.47 MIL/uL (ref 4.22–5.81)
RDW: 13 % (ref 11.5–15.5)
WBC: 14.4 10*3/uL — ABNORMAL HIGH (ref 4.0–10.5)
nRBC: 0 % (ref 0.0–0.2)

## 2019-06-25 LAB — BASIC METABOLIC PANEL
Anion gap: 8 (ref 5–15)
BUN: 13 mg/dL (ref 6–20)
CO2: 23 mmol/L (ref 22–32)
Calcium: 9.1 mg/dL (ref 8.9–10.3)
Chloride: 104 mmol/L (ref 98–111)
Creatinine, Ser: 0.78 mg/dL (ref 0.61–1.24)
GFR calc Af Amer: >60 mL/min (ref >60)
GFR calc non Af Amer: >60 mL/min (ref >60)
Glucose, Bld: 103 mg/dL — ABNORMAL HIGH (ref 70–99)
Potassium: 4.1 mmol/L (ref 3.5–5.1)
Sodium: 135 mmol/L (ref 135–145)

## 2019-06-25 MED ORDER — HYDROCODONE-ACETAMINOPHEN 5-325 MG PO TABS
1.0000 | ORAL_TABLET | Freq: Once | ORAL | Status: AC
  Administered 2019-06-25: 17:00:00 1 via ORAL
  Filled 2019-06-25: qty 1

## 2019-06-25 MED ORDER — IOHEXOL 300 MG/ML  SOLN
75.0000 mL | Freq: Once | INTRAMUSCULAR | Status: AC | PRN
  Administered 2019-06-25: 17:00:00 75 mL via INTRAVENOUS

## 2019-06-25 MED ORDER — PENICILLIN V POTASSIUM 500 MG PO TABS: 500.0000 mg | ORAL_TABLET | Freq: Four times a day (QID) | ORAL | 0 refills | Status: AC

## 2019-06-25 MED ORDER — PENICILLIN V POTASSIUM 250 MG PO TABS
500.0000 mg | ORAL_TABLET | Freq: Once | ORAL | Status: AC
  Administered 2019-06-25: 19:00:00 500 mg via ORAL
  Filled 2019-06-25: qty 2

## 2019-06-25 MED ORDER — HYDROCODONE-ACETAMINOPHEN 5-325 MG PO TABS: 1.0000 | ORAL_TABLET | Freq: Four times a day (QID) | ORAL | 0 refills | Status: AC | PRN

## 2019-06-25 NOTE — ED Notes (Signed)
Pt back from CT

## 2019-06-25 NOTE — ED Triage Notes (Signed)
Patient presents to the ED with left lower tooth pain with facial swelling.  Patient states his throat hurts as well.  No fever reported.

## 2019-06-25 NOTE — Discharge Instructions (Addendum)
Follow up with a dentist next week. °

## 2019-06-25 NOTE — ED Provider Notes (Signed)
[facility] EMERGENCY DEPARTMENT Provider Note   CSN: [id number] Arrival date & time: 06/25/19  1426     History Chief Complaint  Patient presents with  . Dental Pain    Jamie Terry is a 28 y.o. male.  HPI    This patient is a 28 year old male, he denies any chronic medical problems, states he does not take any daily medicines until recently (see below).  He reports having dental pain in his left lower jaw, one of his premolars has become deeply carried and damaged over time causing increasing and worsening pain, he felt like he was having some swelling in that area and thus his girlfriend was able to help him get some antibiotics, he has been taking clindamycin for the last 2 days, he states that he took multiple doses on the first day thinking that the more he took the better pain relief he would have but it did not help.  It was written for 300 mg twice a day for 10 days.  Half the bottle is gone.  He denies fevers or chills but states he has pain in the left jaw as well as the left neck and seems to be having some pain with swallowing.  He denies any fevers or chills, has not been able to see a dentist.  He states he has called the office but nobody is open over the Christmas holiday.  Past Medical History:  Diagnosis Date  . Medical history non-contributory    "I don't really go to the doctor"    Patient Active Problem List   Diagnosis Date Noted  . Rectal bleeding 04/18/2015  . Elevated LFTs 04/18/2015    Past Surgical History:  Procedure Laterality Date  . None to date         Family History  Problem Relation Age of Onset  . Colon cancer Paternal Grandfather     Social History   Tobacco Use  . Smoking status: Current Every Day Smoker    Packs/day: 1.00    Years: 10.00    Pack years: 10.00    Types: Cigarettes  . Smokeless tobacco: Never Used  Substance Use Topics  . Alcohol use: Not Currently    Alcohol/week: 0.0 standard drinks    Comment: Rarely  about 3-4 times a year.  . Drug use: Not Currently    Home Medications Prior to Admission medications   Medication Sig Start Date End Date Taking? Authorizing Provider  acetaminophen (TYLENOL) 500 MG tablet Take 500 mg by mouth as needed.    [provider]  ciprofloxacin (CILOXAN) 0.3 % ophthalmic solution Administer 1 drop, every 2 hours, while awake, for 2 days. Then 1 drop, every 4 hours, while awake, for the next 5 days. 09/21/17   Isla Pence, MD  diclofenac sodium (VOLTAREN) 1 % GEL Apply 2 g topically 4 (four) times daily as needed. 03/11/19   Long, Wonda Olds, MD  HYDROcodone-acetaminophen (NORCO/VICODIN) 5-325 MG tablet Take 1 tablet by mouth every 4 (four) hours as needed. 09/21/17   Isla Pence, MD  methocarbamol (ROBAXIN) 500 MG tablet Take 1 tablet (500 mg total) by mouth every 6 (six) hours as needed for muscle spasms. 03/11/19   Long, Wonda Olds, MD    Allergies    Patient has no known allergies.  Review of Systems   Review of Systems  Constitutional: Negative for fever.  HENT: Positive for dental problem.     Physical Exam Updated Vital Signs BP (!) 164/90 (BP  Location: Right Arm)   Pulse 86   Temp 98.7 F (37.1 C) (Oral)   Resp 14   Ht 1.88 m (6\' 2" )   Wt 111.1 kg   SpO2 100%   BMI 31.46 kg/m   Physical Exam Vitals and nursing note reviewed.  Constitutional:      Appearance: He is well-developed. He is not diaphoretic.  HENT:     Head: Normocephalic and atraumatic.     Comments: There is no trismus or torticollis.  The patient is able to fully open his mouth.  There is no swelling on the gingiva or under the tongue, there is tenderness around the involved tooth on the left which has a deep carry but no signs of gingival abscess, no asymmetry of the jaw or the face.  Phonation is normal, no swelling of the peritonsillar areas, no tenderness in the sublingual area. Eyes:     General:        Right eye: No discharge.        Left eye: No discharge.       Conjunctiva/sclera: Conjunctivae normal.  Neck:     Comments: There is no palpable lymphadenopathy in the neck, there is a slight fullness but no induration, mild tenderness in the submental area, full range of motion of the neck without difficulty Pulmonary:     Effort: Pulmonary effort is normal. No respiratory distress.  Musculoskeletal:     Comments: All 4 extremities without deformity or obvious injury  Skin:    General: Skin is warm and dry.     Findings: No erythema or rash.  Neurological:     Mental Status: He is alert.     Coordination: Coordination normal.     Comments: Normal speech coordination and gait     ED Results / Procedures / Treatments   Labs (all labs ordered are listed, but only abnormal results are displayed) Labs Reviewed  CBC WITH DIFFERENTIAL/PLATELET  BASIC METABOLIC PANEL    EKG None  Radiology No results found.  Procedures Procedures (including critical care time)  Medications Ordered in ED Medications - No data to display  ED Course  I have reviewed the triage vital signs and the nursing notes.  Pertinent labs & imaging results that were available during my care of the patient were reviewed by me and considered in my medical decision making (see chart for details).    MDM Rules/Calculators/A&P                      Dental pain in this patient with generally poor dentition however there is no obvious signs of abscess.  He has been on clindamycin but having worsening pain in the left cervical region and down into the sublingual and submental region.  I do not see any palpable abscesses increasing pain is concerning and now having pain with swallowing is concerning thus a CT scan will be ordered.  At change of shift care will be signed out to Dr. to follow-up results and disposition accordingly.  Final Clinical Impression(s) / ED Diagnoses Final diagnoses:  None    Rx / DC Orders ED Discharge Orders    None        Estell Harpin, MD 06/25/19 1456

## 2019-07-08 NOTE — Progress Notes (Signed)
REVIEWED-NO ADDITIONAL RECOMMENDATIONS. 

## 2020-03-29 IMAGING — CT CT MAXILLOFACIAL W/ CM
3 series · 15 of 47 positions shown, 18 images · IV contrast (Omnipaque or Isovue)
Comparison: None.

CLINICAL DATA: Left mandibular tooth pain and facial swelling for 2
days.

EXAM:
CT MAXILLOFACIAL WITH CONTRAST
TECHNIQUE: Multidetector CT imaging of the maxillofacial structures was
performed with intravenous contrast. Multiplanar CT image
reconstructions were also generated.
CONTRAST:  75mL OMNIPAQUE IOHEXOL 300 MG/ML  SOLN

[Series 2: max soft · axial · 0.40mm/px · z∈[-60,+114]mm · 9 of 101 slices shown, 12 images]
[im 7/101  brain]
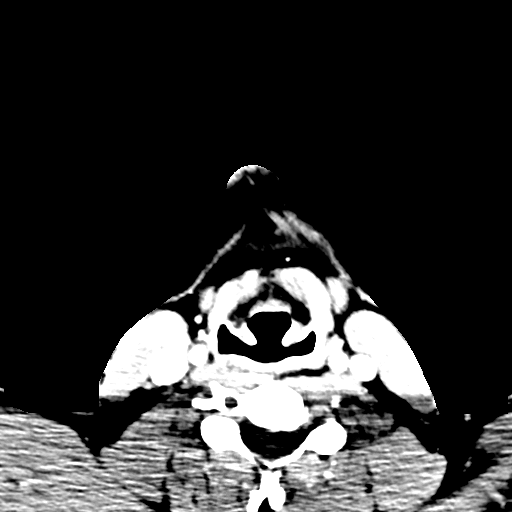
[im 7/101  bone]
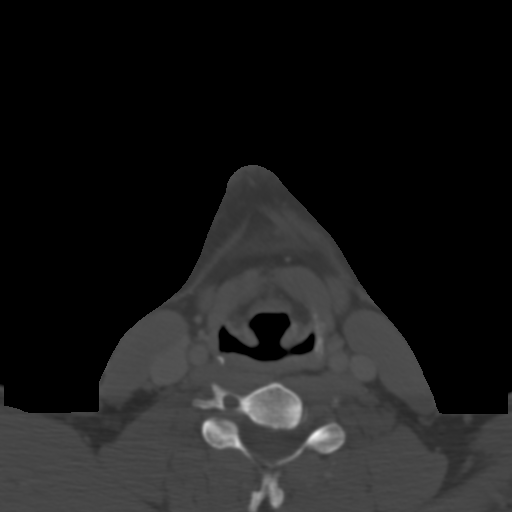
[im 18/101  bone]
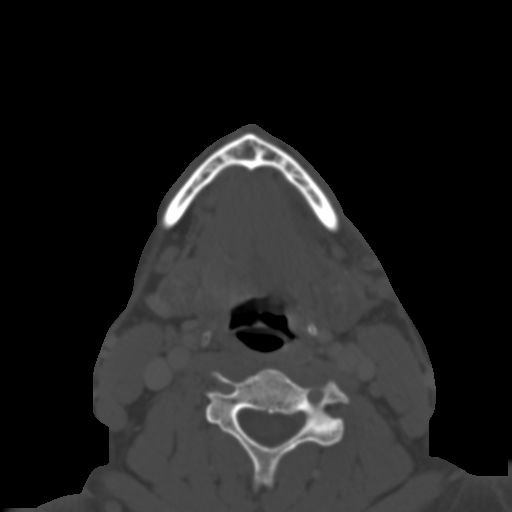
[im 28/101  bone]
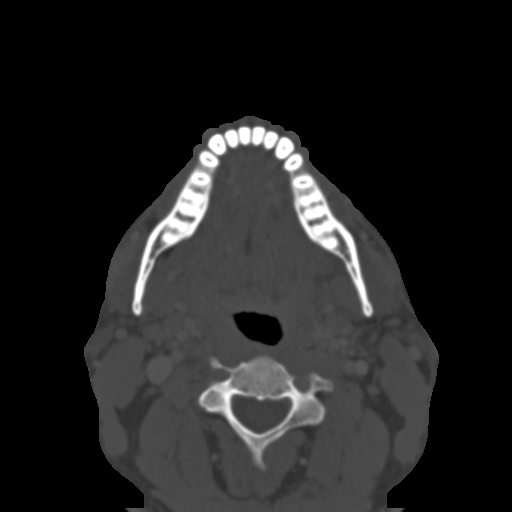
[im 38/101  bone]
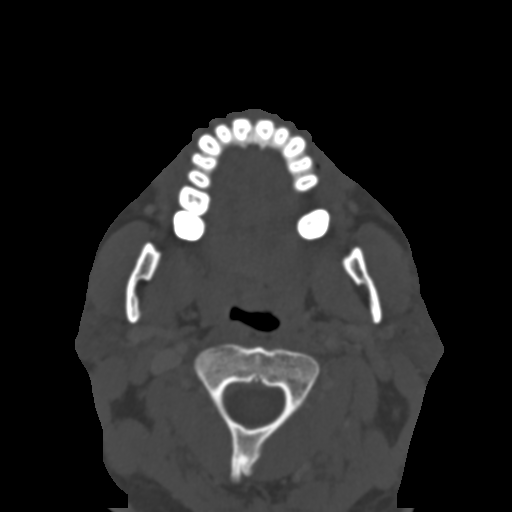
[im 52/101  brain]
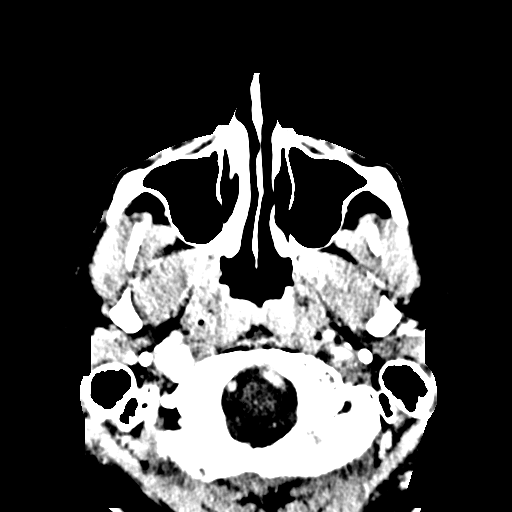
[im 52/101  bone]
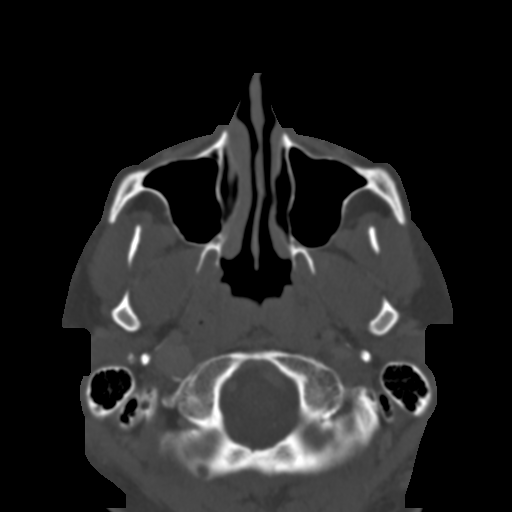
[im 63/101  bone]
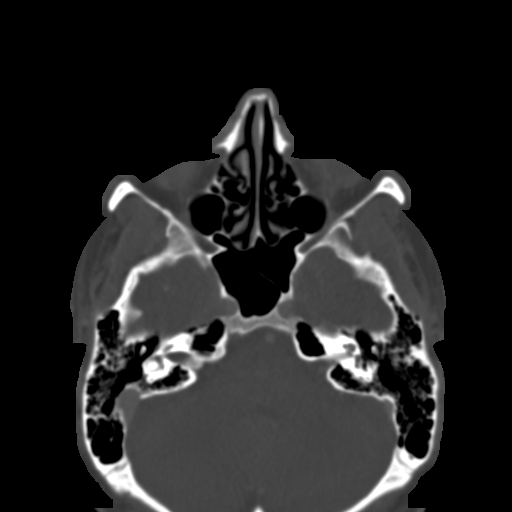
[im 73/101  bone]
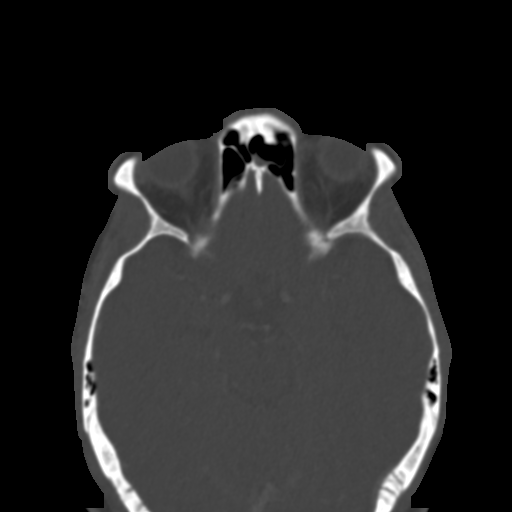
[im 83/101  bone]
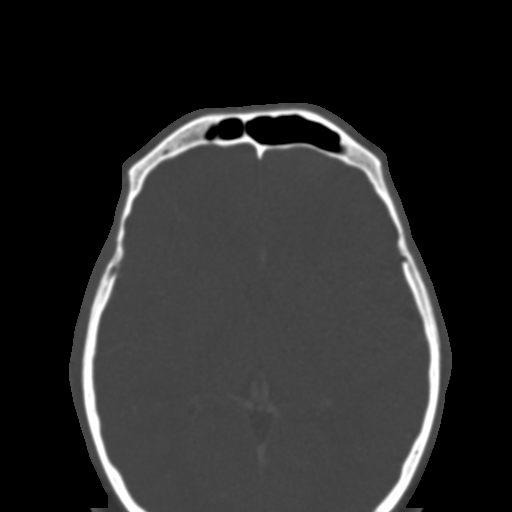
[im 94/101  brain]
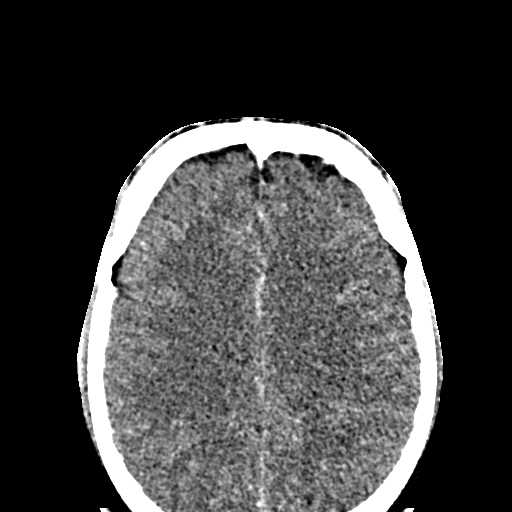
[im 94/101  bone]
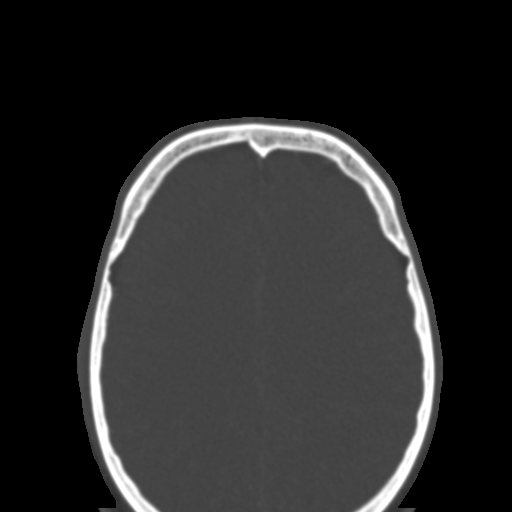

[Series 4: coronal soft · coronal · 0.38mm/px · 3 of 96 slices shown]
[im 32/96  bone]
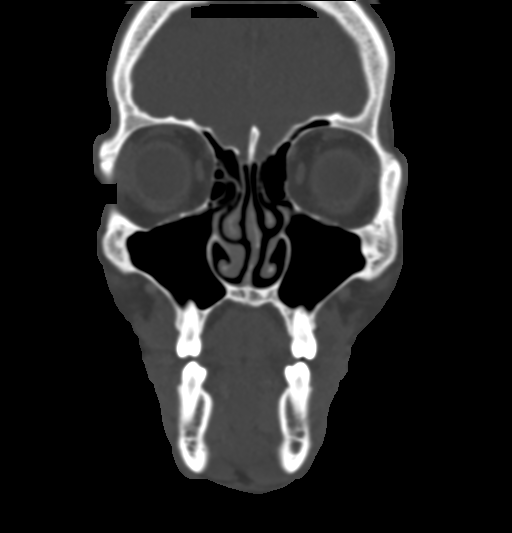
[im 43/96  bone]
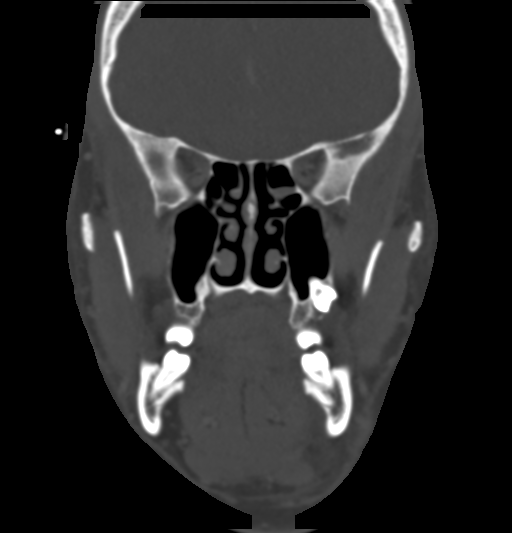
[im 53/96  bone]
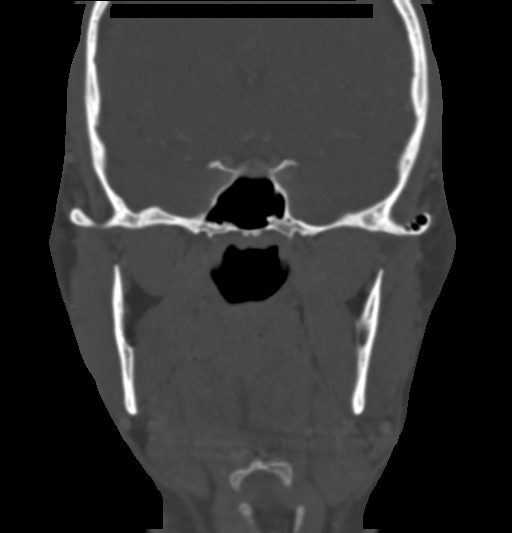

[Series 5: sagittal soft · sagittal · 0.36mm/px · 3 of 100 slices shown]
[im 34/100  bone]
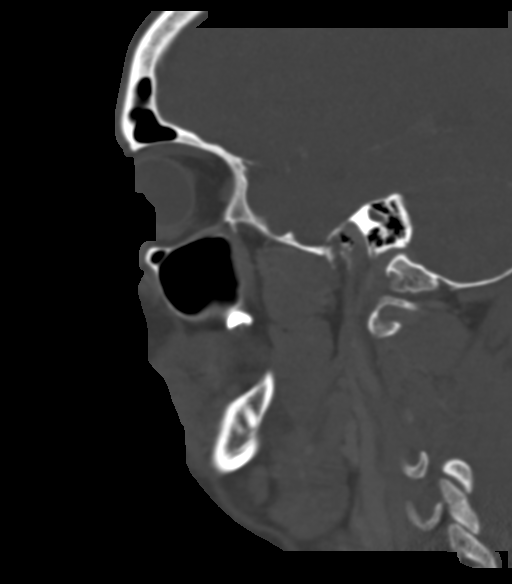
[im 50/100  bone]
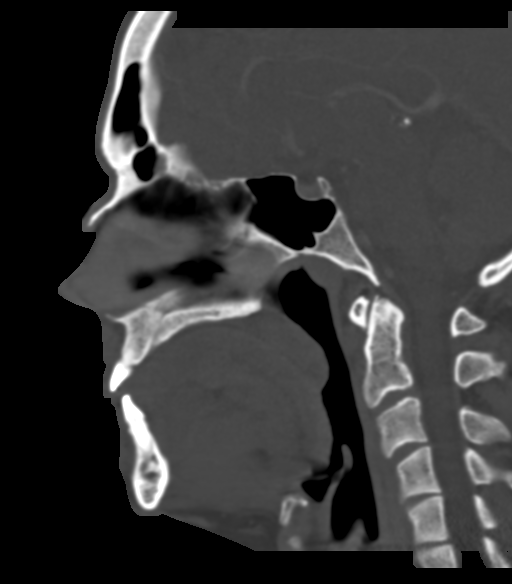
[im 67/100  bone]
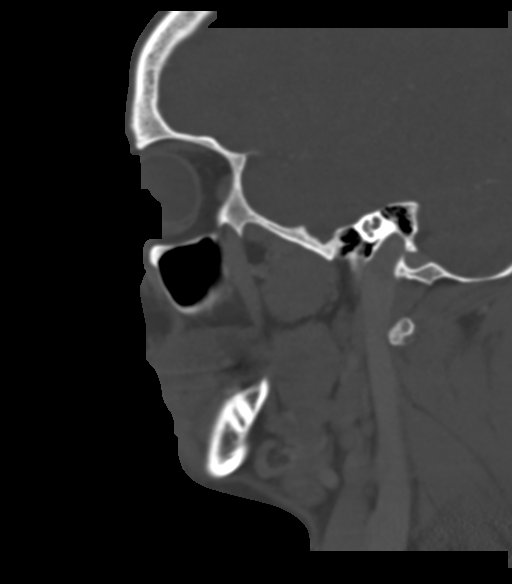

[15 of 47 positions shown; findings below may reference images not displayed]

FINDINGS: Osseous: A prominent dental caries is present at the left first
mandibular molar (#30) there is some lucency about the roots of this
tooth as well. No other significant dental caries are periodontal
disease is evident.

Mandible is otherwise intact. No acute or healing fractures are
present.

Orbits: The globes and orbits are within normal limits.

Sinuses: The paranasal sinuses and mastoid air cells are clear.

Soft tissues: There is stranding of soft tissue medial to the
mandible, at the level of the molar tooth. No defined at
subperiosteal abscess is present. Stranding extends into the
submandibular space. Tongue muscles are within normal limits.
Inflammatory changes are contain by the platysma.

Limited intracranial: The visualized intracranial structures are
within normal limits.
IMPRESSION: 1. Prominent dental caries and periapical abscesses involving the
left first mandibular molar (tooth #30)
2. Inflammatory changes medial and inferior to the left mandible at
the level of the molar tooth suggest infection.
3. No discrete subperiosteal abscess.
4. No other significant dental caries or periodontal disease.
